# Patient Record
Sex: Male | Born: 1965 | Race: White | Hispanic: No | Marital: Single | Smoking: Former smoker
Health system: Southern US, Community
[De-identification: ages and names within clinical notes are randomized; demographics above are authoritative.]

## PROBLEM LIST (undated history)

## (undated) DIAGNOSIS — J439 Emphysema, unspecified: Secondary | ICD-10-CM

## (undated) DIAGNOSIS — J449 Chronic obstructive pulmonary disease, unspecified: Secondary | ICD-10-CM

## (undated) DIAGNOSIS — J189 Pneumonia, unspecified organism: Secondary | ICD-10-CM

## (undated) HISTORY — DX: Chronic obstructive pulmonary disease, unspecified: J44.9

## (undated) HISTORY — DX: Emphysema, unspecified: J43.9

## (undated) HISTORY — DX: Pneumonia, unspecified organism: J18.9

---

## 2000-06-22 ENCOUNTER — Ambulatory Visit (HOSPITAL_COMMUNITY): Admission: RE | Admit: 2000-06-22 | Discharge: 2000-06-22 | Payer: Self-pay | Admitting: Internal Medicine

## 2006-07-16 ENCOUNTER — Ambulatory Visit: Payer: Self-pay | Admitting: Cardiology

## 2006-07-28 ENCOUNTER — Ambulatory Visit: Payer: Self-pay

## 2006-08-20 ENCOUNTER — Ambulatory Visit: Payer: Self-pay | Admitting: Cardiology

## 2006-08-20 ENCOUNTER — Ambulatory Visit (HOSPITAL_COMMUNITY): Admission: RE | Admit: 2006-08-20 | Discharge: 2006-08-20 | Payer: Self-pay | Admitting: Cardiology

## 2010-07-09 NOTE — Assessment & Plan Note (Signed)
[facility]                            CARDIOLOGY OFFICE NOTE   NAME:Belk, QUEST                        MRN:          [id number]  DATE:07/16/2006                            DOB:          [birth year]    Franki Monte (brother of Jaiceon Collister) is here for cardiac evaluation.  Mr. Gartin is 45 years old.  He had some chest discomfort and was seen in  the emergency room at [facility].  His first blood studies showed  no acute abnormality.  He decided that he was stable and did not want to  wait any longer there, and he has remained stable.  There is a strength  family history of coronary disease.  The patient is markedly overweight.  He does work and lift heavy objects on a regular basis.   His chest pain occurred while he was driving to work.  He had sharp  pain.  He did not have any nausea, vomiting or diaphoresis.   PAST MEDICAL HISTORY:  Allergies:  No known drug allergies.   Medications:  Urecholine?   Other medical problems:  See the list below.   SOCIAL HISTORY:  The patient is a Museum/gallery exhibitions officer and a Designer, television/film set.  He also helps unload trailers.  He is single.  He smoked for many years  in the past but stopped smoking years ago.  He drinks a six-pack of beer  per week.  He also drinks an excessive amount of caffeine, drinking  [employer] and sweet tea on a regular basis.   FAMILY HISTORY:  He has a strong family history of coronary disease.  This affects his brother and his father.   OTHER MEDICAL PROBLEMS:  See the list below.   REVIEW OF SYSTEMS:  As of today, he is feeling well.  He is not having  any significant other problems.  He knows that he is overweight.  He  does have some problems with headaches.  He does have some exertional  shortness of breath.  He has had some urinary problems.   PHYSICAL EXAMINATION:  VITAL SIGNS:  The blood pressure is 110/54 with a  pulse of 79.  Weight is 296 pounds.  GENERAL:  The patient is  oriented to person, time and place.  Affect is  normal.  HEENT:  Reveals no xanthelasma.  He has normal extraocular motion.  Conjunctivae are normal.  There are no carotid bruits.  There is no  jugular venous distention.  The patient is edentulous.  He is markedly  overweight.  LUNGS:  Clear.  Respiratory effort is not labored.  CARDIAC:  Reveals an S1 with an S2.  There are no clicks or significant  murmurs.  ABDOMEN:  His abdomen is quite protuberant.  He has normal bowel sounds.  He has 2+ distal pulses.  There is no peripheral edema.  There are no  significant musculoskeletal deformities.   PROBLEMS:  1. Urinary problems of some type?  2. Status post right arm and hand surgery in the past.  3. Strong family history of coronary disease.  4. Recent  chest discomfort.  At this point we do not know if he has      definite coronary disease but he needs an exercise test for sure.   LABORATORIES:  We know that his chest x-ray at [facility] showed question  of a medial right lower lobe atelectasis at that time.   Other labs there also showed a BUN of 15 with a creatinine of 1.0.  His  single troponin was normal.  Liver functions were normal.   We will evaluate the patient with a stress Myoview scan and then I will  see him back.  I spoke with the patient at length about weight loss.  He  must cut down on his calories and caffeine and being to lose weight.     Luis Abed, MD, [facility]  Electronically Signed    JDK/MedQ  DD: 07/16/2006  DT: 07/16/2006  Job #: [id number]   cc:   Antony Madura, M.D.

## 2010-07-09 NOTE — Assessment & Plan Note (Signed)
[facility]                            CARDIOLOGY OFFICE NOTE   NAME:Charbonneau, EULOGIO                        MRN:          [id number]  DATE:08/20/2006                            DOB:          Aug 06, 1965    Mr. Boodram is back for followup.  I saw him on Jul 16, 2006.  His stress  Myoview scan revealed that there were no marked abnormalities.  There  was a question of some decrease in blood pressure with stress, but he  had no EKG change, and he has no other signs of abnormalities.  There  were no marked nuclear changes.  He did have some shortness of breath,  and his O2 sat at peak stress was 90%.   The patient today returns and in fact, appears to be motivated.  It is a  reminder that he did quit smoking approximately a year and a half ago.  He has now begun to try to lose weight.  He has lost 8 pounds since I  saw him last, and this is a good accomplishment for him.   PAST MEDICAL HISTORY:  Other medical problems, see the list below.   ALLERGIES:  No known drug allergies.   MEDICATIONS:  Question Urecholine.   REVIEW OF SYSTEMS:  He is feeling somewhat better; however, he does have  some chest discomfort.  It occurs primarily at rest.  We do need a chest  x-ray.  Otherwise, review of systems is negative.   PHYSICAL EXAMINATION:  VITAL SIGNS:  Blood pressure 116/62 with a pulse  of 90.  The patient is oriented to person, time, and place, and his  affect is normal.   We had a long discussion about his test results.  I explained to him  that I cannot tell him for sure about his coronary arteries, but there  appears to be no obvious significant obstruction at this time.  Of  course, I encouraged all of the forms of primary prevention.  He is  beginning to lose some weight.  We will plan to check his cholesterol.   PROBLEMS:  1. Urinary problems of some type?  2. Status post right arm and hand surgery in the past.  3. Strong family history of  coronary disease.  4. Some chest discomfort.  At this point, I am not convinced that this      is ischemia.  5. A question of an x-ray at [facility] with question of a      slight medial right lower lobe atelectasis and will repeat his      chest x-ray at this time.  He will also have a fasting lipid.     Luis Abed, MD, [facility]  Electronically Signed    JDK/MedQ  DD: 08/20/2006  DT: 08/20/2006  Job #: [id number]   cc:   Antony Madura, M.D.

## 2020-05-23 DIAGNOSIS — J439 Emphysema, unspecified: Secondary | ICD-10-CM | POA: Diagnosis not present

## 2020-05-23 DIAGNOSIS — I700 Atherosclerosis of aorta: Secondary | ICD-10-CM | POA: Diagnosis not present

## 2020-05-23 DIAGNOSIS — J189 Pneumonia, unspecified organism: Secondary | ICD-10-CM | POA: Diagnosis not present

## 2020-05-23 DIAGNOSIS — R918 Other nonspecific abnormal finding of lung field: Secondary | ICD-10-CM | POA: Diagnosis not present

## 2020-05-23 DIAGNOSIS — Z720 Tobacco use: Secondary | ICD-10-CM | POA: Diagnosis not present

## 2020-06-25 DIAGNOSIS — J189 Pneumonia, unspecified organism: Secondary | ICD-10-CM | POA: Diagnosis not present

## 2020-07-10 ENCOUNTER — Other Ambulatory Visit: Payer: Self-pay

## 2020-07-10 ENCOUNTER — Telehealth: Payer: Self-pay | Admitting: Pulmonary Disease

## 2020-07-10 ENCOUNTER — Ambulatory Visit (INDEPENDENT_AMBULATORY_CARE_PROVIDER_SITE_OTHER): Payer: BC Managed Care – PPO | Admitting: Pulmonary Disease

## 2020-07-10 ENCOUNTER — Encounter: Payer: Self-pay | Admitting: Pulmonary Disease

## 2020-07-10 VITALS — BP 140/80 | HR 83 | Temp 98.0°F | Ht 73.0 in | Wt 314.8 lb

## 2020-07-10 DIAGNOSIS — R918 Other nonspecific abnormal finding of lung field: Secondary | ICD-10-CM | POA: Diagnosis not present

## 2020-07-10 DIAGNOSIS — R0602 Shortness of breath: Secondary | ICD-10-CM | POA: Diagnosis not present

## 2020-07-10 NOTE — Patient Instructions (Signed)
Continue using Trelegy on a daily basis Albuterol as needed up to 4 times a day  We will obtain a breathing study on you on next visit  CT scan of the chest to follow-up on the spots in the lungs   Graded exercises as tolerated, on a regular basis  Weight loss efforts   I will see you in 3 months, call with significant concerns

## 2020-07-10 NOTE — Progress Notes (Signed)
Donald Fitzgerald    [id number]    [birth year]  Primary Care Physician:Campbell, Mila Homer., MD  Referring Physician: Wilmer Floor., MD 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 buPROPion (WELLBUTRIN XL) 150 MG 24 hr tablet Take 1 tablet by mouth daily.  . Fluticasone-Umeclidin-Vilant (TRELEGY ELLIPTA) 100-62.5-25 MCG/INH AEPB Inhale into the lungs.  Marland Kitchen ibuprofen (ADVIL) 600 MG tablet Take 600 mg by mouth 2 (two) times daily as needed.  . nicotine (NICODERM CQ - DOSED IN MG/24 HOURS) 14 mg/24hr patch 14 mg daily.  . nicotine (NICODERM CQ - DOSED IN MG/24 HR) 7 mg/24hr patch 7 mg daily.  Marland Kitchen oxyCODONE (OXY IR/ROXICODONE) 5 MG immediate release tablet Take 5 mg by mouth every 6 (six) hours as needed.  . rosuvastatin (CRESTOR) 10 MG tablet Take 10 mg by mouth daily.  . tamsulosin (FLOMAX) 0.4 MG CAPS capsule Take by mouth.   No facility-administered encounter medications on file as of 07/10/2020.    Allergies as of 07/10/2020  . (No Known Allergies)    Past Medical History:  Diagnosis Date  . COPD (chronic obstructive pulmonary disease) (HCC)   . Emphysema of lung (HCC)   . Pneumonia     No family history on file.  Social History   Socioeconomic History  . Marital status: Single    Spouse name: Not on file  . Number of children: Not on file  . Years of education: Not on file  . Highest education level: Not on file   Occupational History  . Not on file  Tobacco Use  . Smoking status: Not on file  . Smokeless tobacco: Not on file  Substance and Sexual Activity  . Alcohol use: Not on file  . Drug use: Not on file  . Sexual activity: Not on file  Other Topics Concern  . Not on file  Social History Narrative  . Not on file   Social Determinants of Health   Financial Resource Strain: Not on file  Food Insecurity: Not on file  Transportation Needs: Not on file  Physical Activity: Not on file  Stress: Not on file  Social Connections: Not on file  Intimate Partner Violence: Not on file    Review of Systems  Constitutional: Negative for fever.  Respiratory: Positive for cough and shortness of breath.   Cardiovascular: Negative for leg swelling.  Genitourinary: Positive for flank pain.  Psychiatric/Behavioral: Negative for sleep disturbance.    Vitals:   07/10/20 1015  BP: 140/80  Pulse: 83  SpO2: 100%     Physical Exam Constitutional:      Appearance: He is obese.  HENT:     Head: Normocephalic.     Nose: No congestion.     Mouth/Throat:     Mouth: Mucous membranes are moist.  Eyes:     General:        Right eye: No  discharge.        Left eye: No discharge.  Cardiovascular:     Rate and Rhythm: Normal rate and regular rhythm.     Heart sounds: No murmur heard. No friction rub.  Pulmonary:     Effort: No respiratory distress.     Breath sounds: No stridor. No wheezing or rhonchi.  Musculoskeletal:     Cervical back: No rigidity or tenderness.  Neurological:     Mental Status: He is alert.  Psychiatric:        Mood and Affect: Mood normal.    Data Reviewed: CT scan from March 2022 reviewed showing right lung nodules Emphysema, some groundglass changes  Chest x-ray with haziness at the bases bilaterally  Assessment:  COPD with exacerbation recently -Did have pneumonia recently -Emphysema noted on CT scan -Currently on Trelegy  Recent pneumonia -Appears to be  improving  Reformed smoker -Quit recently  Shortness of breath on exertion -Mild to moderate limitation  Lung nodules - notable right lung -CT scan was reviewed with the patient  Morbid obesity  Plan/Recommendations: Obtain pulmonary function test  Follow-up CT scan of the chest to follow-up on lung nodules  Graded exercise as tolerated  Encouraged aggressive weight loss measures  Continue Trelegy daily  Rescue albuterol use as needed  I spent 45 minutes dedicated to the care of this patient on the date of this encounter to include previsit review of records, face-to-face time with the patient discussing conditions above, post visit ordering of testing, clinical documentation with electronic health record and communicated necessary findings to members of the patient's care team  Virl Diamond MD [facility] 07/10/2020, 10:17 AM  CC: Wilmer Floor., MD

## 2020-07-10 NOTE — Telephone Encounter (Signed)
It was me His phone must be having issues it never acts like it picks up I will try him again

## 2020-07-10 NOTE — Telephone Encounter (Signed)
Hey guys, we received a message stating someone tried to call this patient but none of [place] in triage did and I see he has an order for CT and was wondering if one of you guys may have called him? Thanks!

## 2020-07-11 NOTE — Telephone Encounter (Signed)
Pt aware of appt.

## 2020-07-20 ENCOUNTER — Other Ambulatory Visit: Payer: BC Managed Care – PPO

## 2020-07-24 DIAGNOSIS — M545 Low back pain, unspecified: Secondary | ICD-10-CM | POA: Diagnosis not present

## 2020-08-01 ENCOUNTER — Other Ambulatory Visit: Payer: Self-pay

## 2020-08-01 ENCOUNTER — Ambulatory Visit (INDEPENDENT_AMBULATORY_CARE_PROVIDER_SITE_OTHER)
Admission: RE | Admit: 2020-08-01 | Discharge: 2020-08-01 | Disposition: A | Discharge status: Home / Self Care | Payer: BC Managed Care – PPO | Source: Ambulatory Visit | Attending: Pulmonary Disease | Admitting: Pulmonary Disease

## 2020-08-01 DIAGNOSIS — R918 Other nonspecific abnormal finding of lung field: Secondary | ICD-10-CM | POA: Diagnosis not present

## 2020-08-03 ENCOUNTER — Telehealth: Payer: Self-pay | Admitting: Pulmonary Disease

## 2020-08-03 NOTE — Telephone Encounter (Signed)
Ov notes reveal recently treated for pneumonia.  Will leave CT for Dr. Wynona Neat to review in detail with patient

## 2020-08-03 NOTE — Telephone Encounter (Signed)
IMPRESSION: 1. Focal area atelectasis and infiltrate in the right middle lobe. No similar areas of focal suspected pneumonia.   2. Scattered areas of mosaic attenuation, less evident than on previous study, likely due to underlying small airways obstructive disease.   3.  Underlying degree of centrilobular emphysematous change.   4. 3 mm nodular opacity anterior segment left upper lobe. No follow-up needed if patient is low-risk. Non-contrast chest CT can be considered in 12 months if patient is high-risk. This recommendation follows the consensus statement: Guidelines for Management of Incidental Pulmonary Nodules Detected on CT Images: From the Fleischner Society 2017; Radiology 2017; 284:228-243.   5. Subcarinal lymph node is mildly enlarged with slightly less enlarged than on prior study. No new adenopathy.   6.  There are foci of coronary artery calcification.   7.  Benign left adrenal adenoma measuring 2.0 x 1 4 cm.   Emphysema (ICD10-J43.9).   These results will be called to the ordering clinician or representative by the Radiologist Assistant, and communication documented in the PACS or [employer].   Routing to [employer] as Dr. Wynona Neat is off today. Tammy, please advise. Thanks!

## 2020-08-06 ENCOUNTER — Other Ambulatory Visit: Payer: Self-pay | Admitting: *Deleted

## 2020-08-06 DIAGNOSIS — R918 Other nonspecific abnormal finding of lung field: Secondary | ICD-10-CM

## 2020-08-06 NOTE — Telephone Encounter (Signed)
Called and spoke with patient, provided results/recommendations per Dr. Wynona Neat.  Attempted to schedule a f/u for patient, however, he works 12 hour shifts and did not have his calender for work in front of him.  Advised to call the office when he can make a f/u appointment.  Patient verbalized understanding.  Order placed for CT scan in 6 months.  Nothing further needed.

## 2020-08-06 NOTE — Telephone Encounter (Signed)
Called patient, left message on cell phone to call back to the office  CT shows resolving pneumonia Small lung nodules Smaller lymph node than on previous CT   Repeat CT in 6 months CT with contrast

## 2020-08-23 ENCOUNTER — Other Ambulatory Visit: Payer: BC Managed Care – PPO

## 2020-08-24 DIAGNOSIS — Z6841 Body Mass Index (BMI) 40.0 and over, adult: Secondary | ICD-10-CM | POA: Diagnosis not present

## 2020-08-29 DIAGNOSIS — M9903 Segmental and somatic dysfunction of lumbar region: Secondary | ICD-10-CM | POA: Diagnosis not present

## 2020-08-29 DIAGNOSIS — M5451 Vertebrogenic low back pain: Secondary | ICD-10-CM | POA: Diagnosis not present

## 2020-08-29 DIAGNOSIS — M461 Sacroiliitis, not elsewhere classified: Secondary | ICD-10-CM | POA: Diagnosis not present

## 2020-08-29 DIAGNOSIS — M9905 Segmental and somatic dysfunction of pelvic region: Secondary | ICD-10-CM | POA: Diagnosis not present

## 2020-09-04 DIAGNOSIS — M9903 Segmental and somatic dysfunction of lumbar region: Secondary | ICD-10-CM | POA: Diagnosis not present

## 2020-09-04 DIAGNOSIS — M5451 Vertebrogenic low back pain: Secondary | ICD-10-CM | POA: Diagnosis not present

## 2020-09-04 DIAGNOSIS — M9905 Segmental and somatic dysfunction of pelvic region: Secondary | ICD-10-CM | POA: Diagnosis not present

## 2020-09-04 DIAGNOSIS — M461 Sacroiliitis, not elsewhere classified: Secondary | ICD-10-CM | POA: Diagnosis not present

## 2020-09-07 DIAGNOSIS — M5451 Vertebrogenic low back pain: Secondary | ICD-10-CM | POA: Diagnosis not present

## 2020-09-07 DIAGNOSIS — M9905 Segmental and somatic dysfunction of pelvic region: Secondary | ICD-10-CM | POA: Diagnosis not present

## 2020-09-07 DIAGNOSIS — M9903 Segmental and somatic dysfunction of lumbar region: Secondary | ICD-10-CM | POA: Diagnosis not present

## 2020-09-07 DIAGNOSIS — M461 Sacroiliitis, not elsewhere classified: Secondary | ICD-10-CM | POA: Diagnosis not present

## 2020-09-08 DIAGNOSIS — R0981 Nasal congestion: Secondary | ICD-10-CM | POA: Diagnosis not present

## 2020-09-08 DIAGNOSIS — R0782 Intercostal pain: Secondary | ICD-10-CM | POA: Diagnosis not present

## 2020-09-08 DIAGNOSIS — Z20828 Contact with and (suspected) exposure to other viral communicable diseases: Secondary | ICD-10-CM | POA: Diagnosis not present

## 2020-09-12 DIAGNOSIS — M461 Sacroiliitis, not elsewhere classified: Secondary | ICD-10-CM | POA: Diagnosis not present

## 2020-09-12 DIAGNOSIS — M5451 Vertebrogenic low back pain: Secondary | ICD-10-CM | POA: Diagnosis not present

## 2020-09-12 DIAGNOSIS — M9905 Segmental and somatic dysfunction of pelvic region: Secondary | ICD-10-CM | POA: Diagnosis not present

## 2020-09-12 DIAGNOSIS — S335XXA Sprain of ligaments of lumbar spine, initial encounter: Secondary | ICD-10-CM | POA: Diagnosis not present

## 2020-09-12 DIAGNOSIS — M9903 Segmental and somatic dysfunction of lumbar region: Secondary | ICD-10-CM | POA: Diagnosis not present

## 2020-09-21 DIAGNOSIS — M461 Sacroiliitis, not elsewhere classified: Secondary | ICD-10-CM | POA: Diagnosis not present

## 2020-09-21 DIAGNOSIS — M9905 Segmental and somatic dysfunction of pelvic region: Secondary | ICD-10-CM | POA: Diagnosis not present

## 2020-09-21 DIAGNOSIS — M9903 Segmental and somatic dysfunction of lumbar region: Secondary | ICD-10-CM | POA: Diagnosis not present

## 2020-09-21 DIAGNOSIS — M5451 Vertebrogenic low back pain: Secondary | ICD-10-CM | POA: Diagnosis not present

## 2020-09-26 ENCOUNTER — Other Ambulatory Visit: Payer: Self-pay

## 2020-09-26 ENCOUNTER — Ambulatory Visit: Payer: BC Managed Care – PPO | Admitting: Pulmonary Disease

## 2020-09-26 ENCOUNTER — Encounter: Payer: Self-pay | Admitting: Pulmonary Disease

## 2020-09-26 VITALS — BP 118/72 | HR 79 | Temp 98.2°F | Ht 74.0 in | Wt 323.0 lb

## 2020-09-26 DIAGNOSIS — R0602 Shortness of breath: Secondary | ICD-10-CM

## 2020-09-26 NOTE — Patient Instructions (Signed)
Continue Trelegy -Make sure you rinse your mouth following use  We will get that breathing study prior to your next visit-on the day of visit  I will see you back in 6 months  Call with significant concerns

## 2020-09-26 NOTE — Progress Notes (Signed)
Donald Fitzgerald    [id number]    [birth year]  Primary Care Physician:Campbell, Mila Homer., MD  Referring Physician: Wilmer Floor., MD 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   buPROPion (WELLBUTRIN XL) 150 MG 24 hr tablet Take 1 tablet by mouth daily.   Fluticasone-Umeclidin-Vilant (TRELEGY ELLIPTA) 100-62.5-25 MCG/INH AEPB Inhale into the lungs.   ibuprofen (ADVIL) 600 MG tablet Take 600 mg by mouth 2 (two) times daily as needed.   nicotine (NICODERM CQ - DOSED IN MG/24 HOURS) 14 mg/24hr patch 14 mg daily.   nicotine (NICODERM CQ - DOSED IN MG/24 HR) 7 mg/24hr patch 7 mg daily.   oxyCODONE (OXY IR/ROXICODONE) 5 MG immediate release tablet Take 5 mg by mouth every 6 (six) hours as needed.   rosuvastatin (CRESTOR) 10 MG tablet Take 10 mg by mouth daily.   tamsulosin (FLOMAX) 0.4 MG CAPS capsule Take by mouth.   No facility-administered encounter medications on file as of 09/26/2020.    Allergies as of 09/26/2020   (No Known Allergies)    Past Medical History:  Diagnosis Date   COPD (chronic obstructive pulmonary disease) (HCC)    Emphysema of lung (HCC)     Pneumonia     No family history on file.  Social History   Socioeconomic History   Marital status: Single    Spouse name: Not on file   Number of children: Not on file   Years of education: Not on file   Highest education level: Not on file  Occupational History   Not on file  Tobacco Use   Smoking status: Former    Packs/day: 2.00    Years: 41.00    Pack years: 82.00    Types: Cigarettes    Start date: 07/11/1978    Quit date: 06/26/2020    Years since quitting: 0.2   Smokeless tobacco: Former    Quit date: 06/26/2020  Substance and Sexual Activity   Alcohol use: Not on file   Drug use: Not on file   Sexual activity: Not on file  Other Topics Concern   Not on file  Social History Narrative   Not on file   Social Determinants of Health   Financial Resource Strain: Not on file  Food Insecurity: Not on file  Transportation Needs: Not on file  Physical Activity: Not on file  Stress: Not on file  Social Connections: Not on file  Intimate Partner Violence: Not on file    Review of Systems  Constitutional:  Negative for fever.  Respiratory:  Positive for cough and shortness of breath.   Cardiovascular:  Negative for leg swelling.  Psychiatric/Behavioral:  Negative for sleep disturbance.    Vitals:   09/26/20 1611  BP: 118/72  Pulse: 79  Temp: 98.2 F (36.8 C)  SpO2: 95%    Physical Exam Constitutional:      Appearance: He is obese.  Eyes:     General:        Right eye: No discharge.        Left eye: No discharge.  Cardiovascular:     Rate and Rhythm: Normal rate and regular rhythm.     Heart sounds: No murmur heard.   No friction rub.  Pulmonary:     Effort: No respiratory distress.     Breath sounds: No stridor. No wheezing or rhonchi.  Musculoskeletal:     Cervical back: No rigidity or tenderness.  Neurological:     Mental Status: He is alert.  Psychiatric:        Mood and Affect: Mood normal.   Data Reviewed: CT scan from March 2022 reviewed  showing right lung nodules Emphysema, some groundglass changes  Chest x-ray with haziness at the bases bilaterally  Assessment:  COPD with exacerbation recently -Appears stable at the present time -Continue Trelegy on a regular basis Discussed the need to rinse on a regular basis following Trelegy use  Reformed smoker -Encouraged to stay off cigarettes  Shortness of breath on exertion -Mild to moderate limitation -This appears to be stable -We will continue to follow  Lung nodules - notable right lung -CT scan was reviewed with the patient  Morbid obesity  Plan/Recommendations: Will obtain PFT at next visit  Graded exercise as tolerated  Continue Trelegy daily  Rescue albuterol as needed  Follow-up in 6 months  Virl Diamond MD [facility] 09/26/2020, 4:22 PM  CC: Wilmer Floor., MD

## 2020-09-27 DIAGNOSIS — M9903 Segmental and somatic dysfunction of lumbar region: Secondary | ICD-10-CM | POA: Diagnosis not present

## 2020-09-27 DIAGNOSIS — M5451 Vertebrogenic low back pain: Secondary | ICD-10-CM | POA: Diagnosis not present

## 2020-09-27 DIAGNOSIS — M461 Sacroiliitis, not elsewhere classified: Secondary | ICD-10-CM | POA: Diagnosis not present

## 2020-09-27 DIAGNOSIS — M9905 Segmental and somatic dysfunction of pelvic region: Secondary | ICD-10-CM | POA: Diagnosis not present

## 2020-11-13 DIAGNOSIS — R051 Acute cough: Secondary | ICD-10-CM | POA: Diagnosis not present

## 2020-11-13 DIAGNOSIS — Z20828 Contact with and (suspected) exposure to other viral communicable diseases: Secondary | ICD-10-CM | POA: Diagnosis not present

## 2020-11-13 DIAGNOSIS — B370 Candidal stomatitis: Secondary | ICD-10-CM | POA: Diagnosis not present

## 2020-11-13 DIAGNOSIS — R0981 Nasal congestion: Secondary | ICD-10-CM | POA: Diagnosis not present

## 2020-11-13 DIAGNOSIS — J440 Chronic obstructive pulmonary disease with acute lower respiratory infection: Secondary | ICD-10-CM | POA: Diagnosis not present

## 2020-11-13 DIAGNOSIS — J189 Pneumonia, unspecified organism: Secondary | ICD-10-CM | POA: Diagnosis not present

## 2020-11-14 DIAGNOSIS — I10 Essential (primary) hypertension: Secondary | ICD-10-CM | POA: Diagnosis not present

## 2020-11-14 DIAGNOSIS — X501XXA Overexertion from prolonged static or awkward postures, initial encounter: Secondary | ICD-10-CM | POA: Diagnosis not present

## 2020-11-14 DIAGNOSIS — M549 Dorsalgia, unspecified: Secondary | ICD-10-CM | POA: Diagnosis not present

## 2020-11-14 DIAGNOSIS — R069 Unspecified abnormalities of breathing: Secondary | ICD-10-CM | POA: Diagnosis not present

## 2020-11-14 DIAGNOSIS — S335XXA Sprain of ligaments of lumbar spine, initial encounter: Secondary | ICD-10-CM | POA: Diagnosis not present

## 2020-11-14 DIAGNOSIS — J439 Emphysema, unspecified: Secondary | ICD-10-CM | POA: Diagnosis not present

## 2020-11-14 DIAGNOSIS — J80 Acute respiratory distress syndrome: Secondary | ICD-10-CM | POA: Diagnosis not present

## 2020-11-14 DIAGNOSIS — M545 Low back pain, unspecified: Secondary | ICD-10-CM | POA: Diagnosis not present

## 2020-12-06 DIAGNOSIS — R635 Abnormal weight gain: Secondary | ICD-10-CM | POA: Diagnosis not present

## 2020-12-06 DIAGNOSIS — Z6841 Body Mass Index (BMI) 40.0 and over, adult: Secondary | ICD-10-CM | POA: Diagnosis not present

## 2020-12-06 DIAGNOSIS — J189 Pneumonia, unspecified organism: Secondary | ICD-10-CM | POA: Diagnosis not present

## 2021-01-30 ENCOUNTER — Ambulatory Visit (INDEPENDENT_AMBULATORY_CARE_PROVIDER_SITE_OTHER): Payer: BC Managed Care – PPO | Admitting: Pulmonary Disease

## 2021-01-30 ENCOUNTER — Other Ambulatory Visit: Payer: Self-pay

## 2021-01-30 DIAGNOSIS — R0602 Shortness of breath: Secondary | ICD-10-CM | POA: Diagnosis not present

## 2021-01-30 LAB — PULMONARY FUNCTION TEST
DL/VA % pred: 88 %
DL/VA: 3.77 ml/min/mmHg/L
DLCO cor % pred: 63 %
DLCO cor: 20.29 ml/min/mmHg
DLCO unc % pred: 63 %
DLCO unc: 20.29 ml/min/mmHg
FEF 25-75 Post: 2.29 L/sec
FEF 25-75 Pre: 1.67 L/sec
FEF2575-%Change-Post: 37 %
FEF2575-%Pred-Post: 63 %
FEF2575-%Pred-Pre: 46 %
FEV1-%Change-Post: 6 %
FEV1-%Pred-Post: 58 %
FEV1-%Pred-Pre: 54 %
FEV1-Post: 2.51 L
FEV1-Pre: 2.36 L
FEV1FVC-%Change-Post: 4 %
FEV1FVC-%Pred-Pre: 95 %
FEV6-%Change-Post: 1 %
FEV6-%Pred-Post: 60 %
FEV6-%Pred-Pre: 59 %
FEV6-Post: 3.29 L
FEV6-Pre: 3.23 L
FEV6FVC-%Change-Post: 0 %
FEV6FVC-%Pred-Post: 103 %
FEV6FVC-%Pred-Pre: 104 %
FVC-%Change-Post: 1 %
FVC-%Pred-Post: 58 %
FVC-%Pred-Pre: 57 %
FVC-Post: 3.29 L
FVC-Pre: 3.23 L
Post FEV1/FVC ratio: 76 %
Post FEV6/FVC ratio: 100 %
Pre FEV1/FVC ratio: 73 %
Pre FEV6/FVC Ratio: 100 %
RV % pred: 104 %
RV: 2.46 L
TLC % pred: 81 %
TLC: 6.35 L

## 2021-01-30 NOTE — Progress Notes (Signed)
Full PFT performed today. °

## 2021-01-30 NOTE — Patient Instructions (Signed)
Full PFT performed today. °

## 2021-01-31 DIAGNOSIS — J449 Chronic obstructive pulmonary disease, unspecified: Secondary | ICD-10-CM | POA: Diagnosis not present

## 2021-01-31 DIAGNOSIS — R635 Abnormal weight gain: Secondary | ICD-10-CM | POA: Diagnosis not present

## 2021-01-31 DIAGNOSIS — I700 Atherosclerosis of aorta: Secondary | ICD-10-CM | POA: Diagnosis not present

## 2021-01-31 DIAGNOSIS — I10 Essential (primary) hypertension: Secondary | ICD-10-CM | POA: Diagnosis not present

## 2021-02-07 ENCOUNTER — Other Ambulatory Visit: Payer: BC Managed Care – PPO

## 2021-04-02 DIAGNOSIS — J449 Chronic obstructive pulmonary disease, unspecified: Secondary | ICD-10-CM | POA: Diagnosis not present

## 2021-04-02 DIAGNOSIS — I10 Essential (primary) hypertension: Secondary | ICD-10-CM | POA: Diagnosis not present

## 2021-04-02 DIAGNOSIS — Z125 Encounter for screening for malignant neoplasm of prostate: Secondary | ICD-10-CM | POA: Diagnosis not present

## 2021-04-02 DIAGNOSIS — F331 Major depressive disorder, recurrent, moderate: Secondary | ICD-10-CM | POA: Diagnosis not present

## 2021-04-02 DIAGNOSIS — E291 Testicular hypofunction: Secondary | ICD-10-CM | POA: Diagnosis not present

## 2021-04-02 DIAGNOSIS — R739 Hyperglycemia, unspecified: Secondary | ICD-10-CM | POA: Diagnosis not present

## 2021-04-02 DIAGNOSIS — I700 Atherosclerosis of aorta: Secondary | ICD-10-CM | POA: Diagnosis not present

## 2021-08-06 DIAGNOSIS — Z6839 Body mass index (BMI) 39.0-39.9, adult: Secondary | ICD-10-CM | POA: Diagnosis not present

## 2021-08-06 DIAGNOSIS — R635 Abnormal weight gain: Secondary | ICD-10-CM | POA: Diagnosis not present

## 2021-09-03 DIAGNOSIS — Z6839 Body mass index (BMI) 39.0-39.9, adult: Secondary | ICD-10-CM | POA: Diagnosis not present

## 2021-09-03 DIAGNOSIS — R635 Abnormal weight gain: Secondary | ICD-10-CM | POA: Diagnosis not present

## 2021-10-01 DIAGNOSIS — J449 Chronic obstructive pulmonary disease, unspecified: Secondary | ICD-10-CM | POA: Diagnosis not present

## 2021-10-01 DIAGNOSIS — F331 Major depressive disorder, recurrent, moderate: Secondary | ICD-10-CM | POA: Diagnosis not present

## 2021-10-01 DIAGNOSIS — R739 Hyperglycemia, unspecified: Secondary | ICD-10-CM | POA: Diagnosis not present

## 2021-10-01 DIAGNOSIS — I10 Essential (primary) hypertension: Secondary | ICD-10-CM | POA: Diagnosis not present

## 2021-10-01 DIAGNOSIS — I700 Atherosclerosis of aorta: Secondary | ICD-10-CM | POA: Diagnosis not present

## 2021-10-01 DIAGNOSIS — E291 Testicular hypofunction: Secondary | ICD-10-CM | POA: Diagnosis not present

## 2022-01-15 DIAGNOSIS — L0501 Pilonidal cyst with abscess: Secondary | ICD-10-CM | POA: Diagnosis not present

## 2022-01-17 DIAGNOSIS — L0231 Cutaneous abscess of buttock: Secondary | ICD-10-CM | POA: Diagnosis not present

## 2022-01-30 DIAGNOSIS — L0591 Pilonidal cyst without abscess: Secondary | ICD-10-CM | POA: Diagnosis not present

## 2022-04-01 DIAGNOSIS — J449 Chronic obstructive pulmonary disease, unspecified: Secondary | ICD-10-CM | POA: Diagnosis not present

## 2022-04-01 DIAGNOSIS — I700 Atherosclerosis of aorta: Secondary | ICD-10-CM | POA: Diagnosis not present

## 2022-04-01 DIAGNOSIS — Z23 Encounter for immunization: Secondary | ICD-10-CM | POA: Diagnosis not present

## 2022-04-01 DIAGNOSIS — I10 Essential (primary) hypertension: Secondary | ICD-10-CM | POA: Diagnosis not present

## 2022-04-01 DIAGNOSIS — R739 Hyperglycemia, unspecified: Secondary | ICD-10-CM | POA: Diagnosis not present

## 2022-04-01 DIAGNOSIS — F331 Major depressive disorder, recurrent, moderate: Secondary | ICD-10-CM | POA: Diagnosis not present

## 2022-04-01 DIAGNOSIS — E291 Testicular hypofunction: Secondary | ICD-10-CM | POA: Diagnosis not present

## 2022-06-19 DIAGNOSIS — J449 Chronic obstructive pulmonary disease, unspecified: Secondary | ICD-10-CM | POA: Diagnosis not present

## 2022-06-19 DIAGNOSIS — E291 Testicular hypofunction: Secondary | ICD-10-CM | POA: Diagnosis not present

## 2022-06-19 DIAGNOSIS — R635 Abnormal weight gain: Secondary | ICD-10-CM | POA: Diagnosis not present

## 2022-06-19 DIAGNOSIS — Z6838 Body mass index (BMI) 38.0-38.9, adult: Secondary | ICD-10-CM | POA: Diagnosis not present

## 2022-07-17 DIAGNOSIS — Z6838 Body mass index (BMI) 38.0-38.9, adult: Secondary | ICD-10-CM | POA: Diagnosis not present

## 2022-07-17 DIAGNOSIS — R635 Abnormal weight gain: Secondary | ICD-10-CM | POA: Diagnosis not present

## 2022-08-19 DIAGNOSIS — N401 Enlarged prostate with lower urinary tract symptoms: Secondary | ICD-10-CM | POA: Diagnosis not present

## 2022-08-19 DIAGNOSIS — Z6836 Body mass index (BMI) 36.0-36.9, adult: Secondary | ICD-10-CM | POA: Diagnosis not present

## 2022-08-19 DIAGNOSIS — L84 Corns and callosities: Secondary | ICD-10-CM | POA: Diagnosis not present

## 2022-08-19 DIAGNOSIS — R635 Abnormal weight gain: Secondary | ICD-10-CM | POA: Diagnosis not present

## 2022-09-02 DIAGNOSIS — K5909 Other constipation: Secondary | ICD-10-CM | POA: Diagnosis not present

## 2022-09-02 DIAGNOSIS — Z79899 Other long term (current) drug therapy: Secondary | ICD-10-CM | POA: Diagnosis not present

## 2022-09-02 DIAGNOSIS — N401 Enlarged prostate with lower urinary tract symptoms: Secondary | ICD-10-CM | POA: Diagnosis not present

## 2022-09-02 DIAGNOSIS — Z125 Encounter for screening for malignant neoplasm of prostate: Secondary | ICD-10-CM | POA: Diagnosis not present

## 2022-09-02 DIAGNOSIS — R339 Retention of urine, unspecified: Secondary | ICD-10-CM | POA: Diagnosis not present

## 2022-09-02 DIAGNOSIS — E669 Obesity, unspecified: Secondary | ICD-10-CM | POA: Diagnosis not present

## 2022-09-02 DIAGNOSIS — Z79890 Hormone replacement therapy (postmenopausal): Secondary | ICD-10-CM | POA: Diagnosis not present

## 2022-09-05 DIAGNOSIS — M79671 Pain in right foot: Secondary | ICD-10-CM | POA: Diagnosis not present

## 2022-09-05 DIAGNOSIS — M79672 Pain in left foot: Secondary | ICD-10-CM | POA: Diagnosis not present

## 2022-09-05 DIAGNOSIS — R269 Unspecified abnormalities of gait and mobility: Secondary | ICD-10-CM | POA: Diagnosis not present

## 2022-09-16 DIAGNOSIS — Z6835 Body mass index (BMI) 35.0-35.9, adult: Secondary | ICD-10-CM | POA: Diagnosis not present

## 2022-09-16 DIAGNOSIS — R635 Abnormal weight gain: Secondary | ICD-10-CM | POA: Diagnosis not present

## 2022-09-16 DIAGNOSIS — J439 Emphysema, unspecified: Secondary | ICD-10-CM | POA: Diagnosis not present

## 2022-10-14 DIAGNOSIS — R635 Abnormal weight gain: Secondary | ICD-10-CM | POA: Diagnosis not present

## 2022-10-14 DIAGNOSIS — I10 Essential (primary) hypertension: Secondary | ICD-10-CM | POA: Diagnosis not present

## 2022-10-14 DIAGNOSIS — I700 Atherosclerosis of aorta: Secondary | ICD-10-CM | POA: Diagnosis not present

## 2022-10-14 DIAGNOSIS — Z6834 Body mass index (BMI) 34.0-34.9, adult: Secondary | ICD-10-CM | POA: Diagnosis not present

## 2022-11-19 DIAGNOSIS — R635 Abnormal weight gain: Secondary | ICD-10-CM | POA: Diagnosis not present

## 2022-11-19 DIAGNOSIS — Z6833 Body mass index (BMI) 33.0-33.9, adult: Secondary | ICD-10-CM | POA: Diagnosis not present

## 2022-11-19 DIAGNOSIS — J439 Emphysema, unspecified: Secondary | ICD-10-CM | POA: Diagnosis not present

## 2022-11-19 DIAGNOSIS — N400 Enlarged prostate without lower urinary tract symptoms: Secondary | ICD-10-CM | POA: Diagnosis not present

## 2022-12-17 DIAGNOSIS — J439 Emphysema, unspecified: Secondary | ICD-10-CM | POA: Diagnosis not present

## 2022-12-17 DIAGNOSIS — R635 Abnormal weight gain: Secondary | ICD-10-CM | POA: Diagnosis not present

## 2022-12-17 DIAGNOSIS — Z6833 Body mass index (BMI) 33.0-33.9, adult: Secondary | ICD-10-CM | POA: Diagnosis not present

## 2022-12-23 IMAGING — CT CT CHEST W/O CM
2 of 4 series · 14 of 36 positions shown, 17 images · non-contrast
Comparison: Chest CT June 25, 2020; chest CT May 11, 2020

CLINICAL DATA: Previous areas of airspace opacity. Underlying
emphysematous change.

EXAM:
CT CHEST WITHOUT CONTRAST
TECHNIQUE: Multidetector CT imaging of the chest was performed following the
standard protocol without IV contrast.

[Series 2: thorax · axial · 0.88mm/px · z∈[-341,-71]mm · 11 of 161 slices shown, 14 images]
[im 13/161  mediastinal]
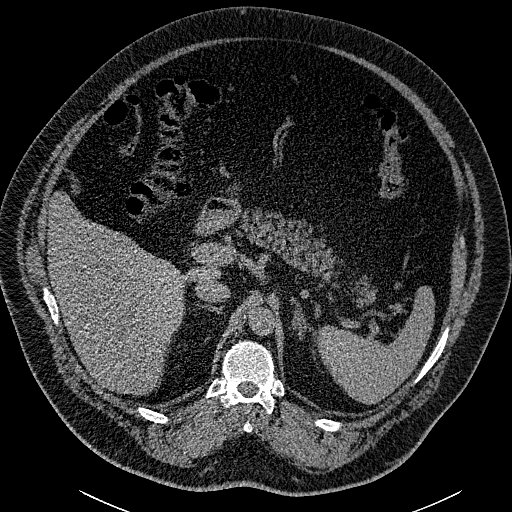
[im 13/161  lung]
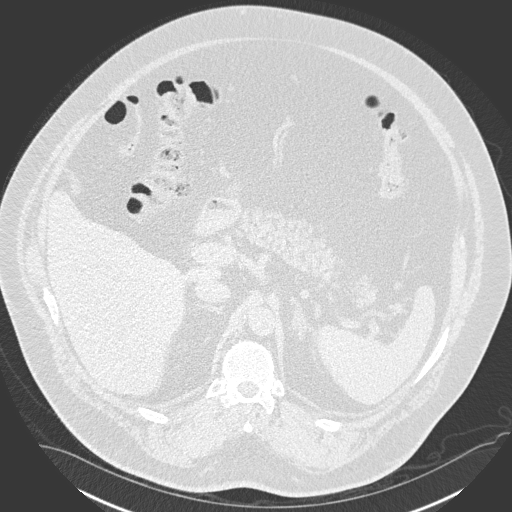
[im 25/161  lung]
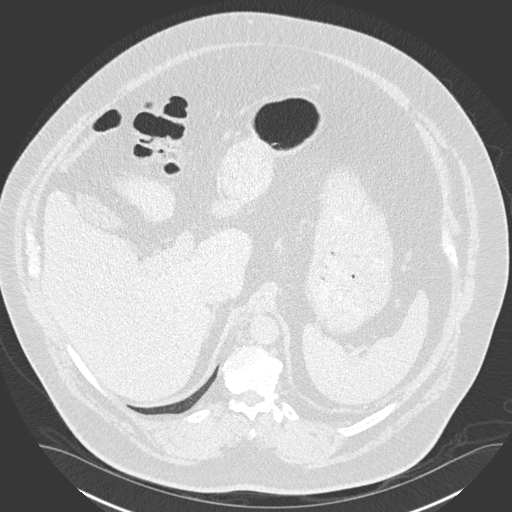
[im 37/161  lung]
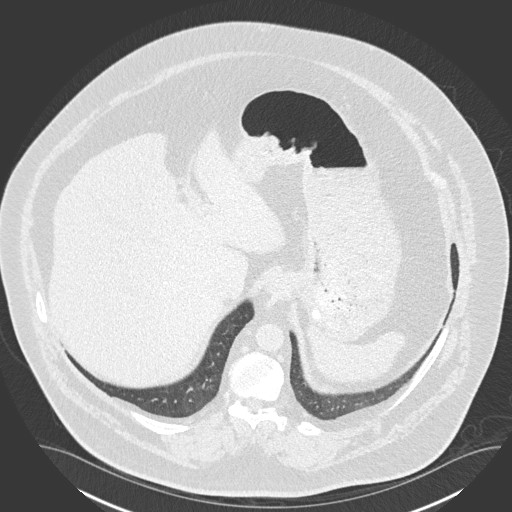
[im 50/161  lung]
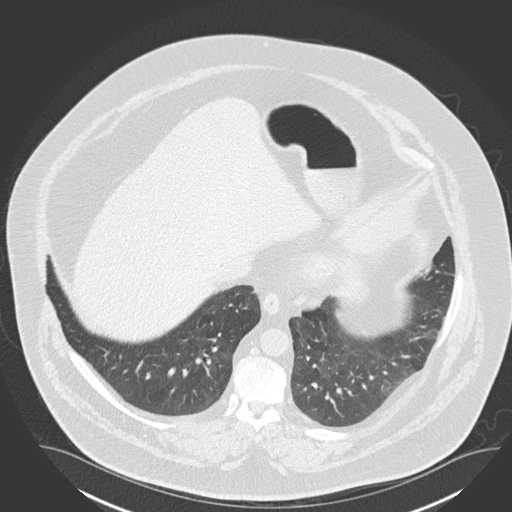
[im 62/161  mediastinal]
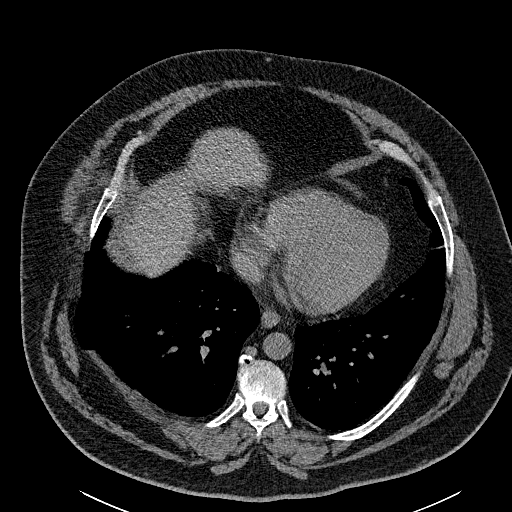
[im 62/161  lung]
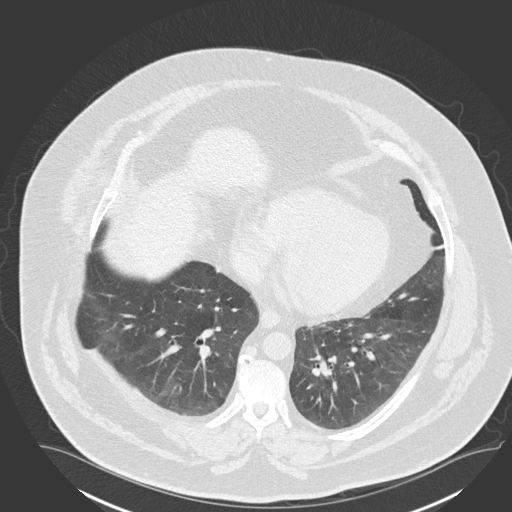
[im 87/161  lung]
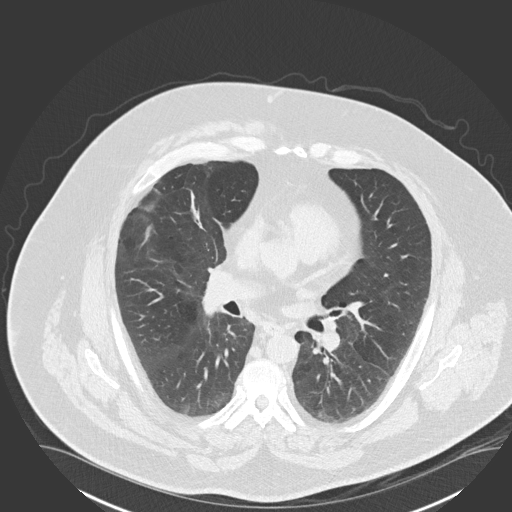
[im 99/161  lung]
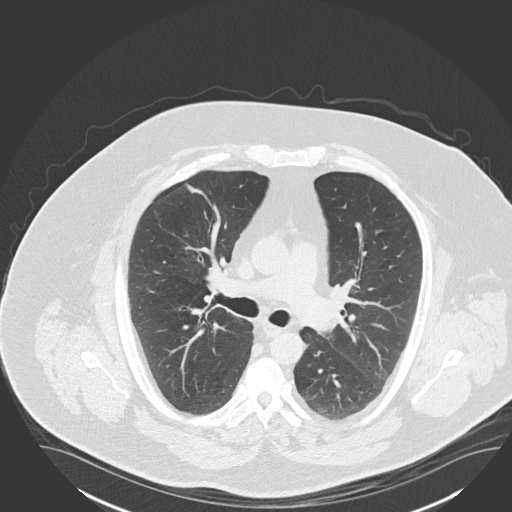
[im 111/161  lung]
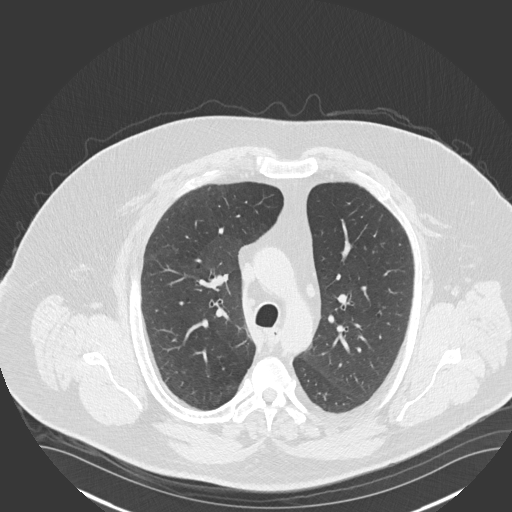
[im 124/161  mediastinal]
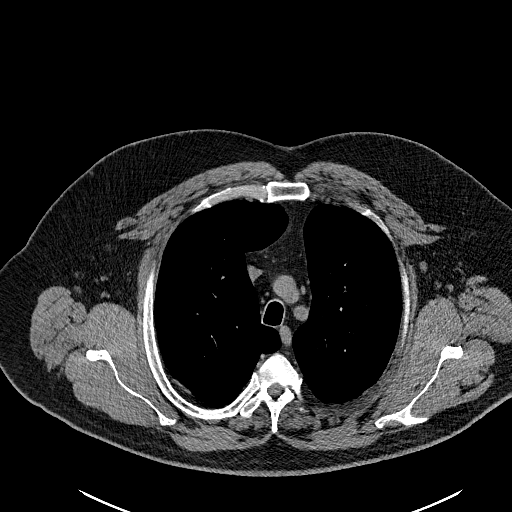
[im 124/161  lung]
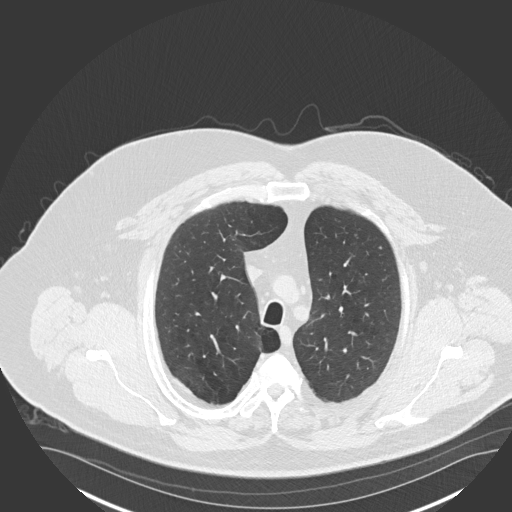
[im 136/161  lung]
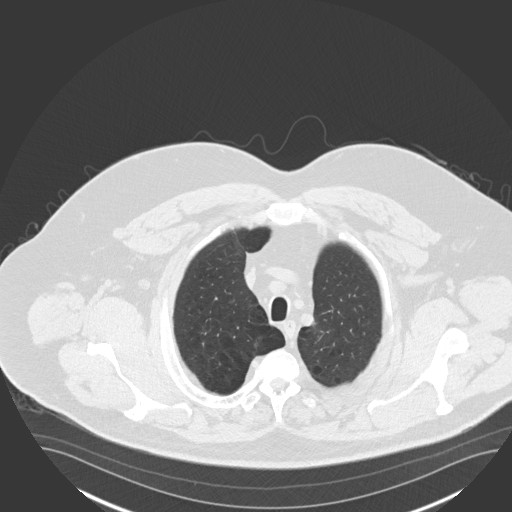
[im 148/161  lung]
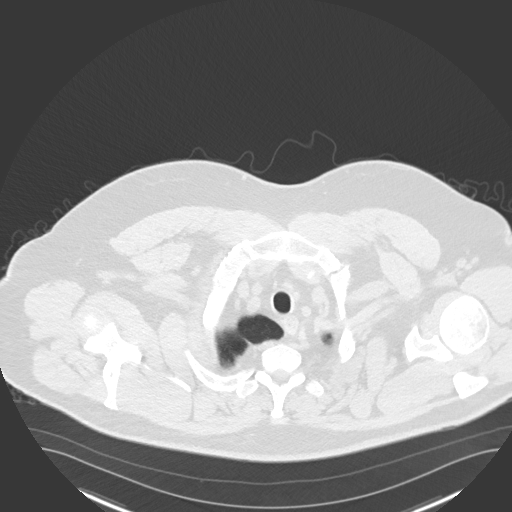

[Series 5: coronal · coronal · 0.63mm/px · 3 of 151 slices shown]
[im 31/151  lung]
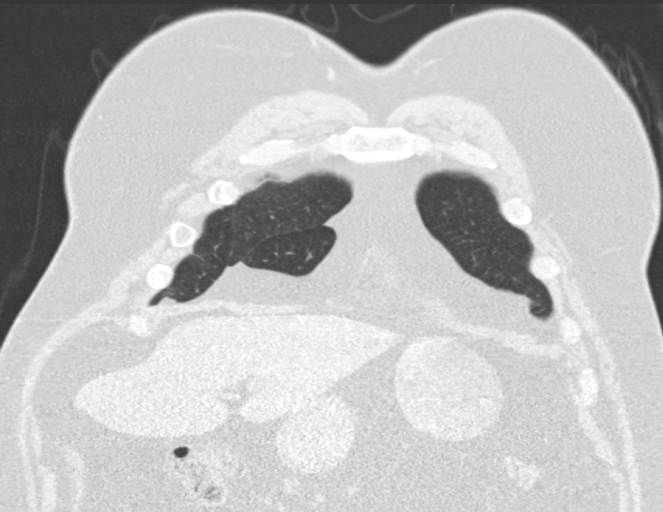
[im 61/151  lung]
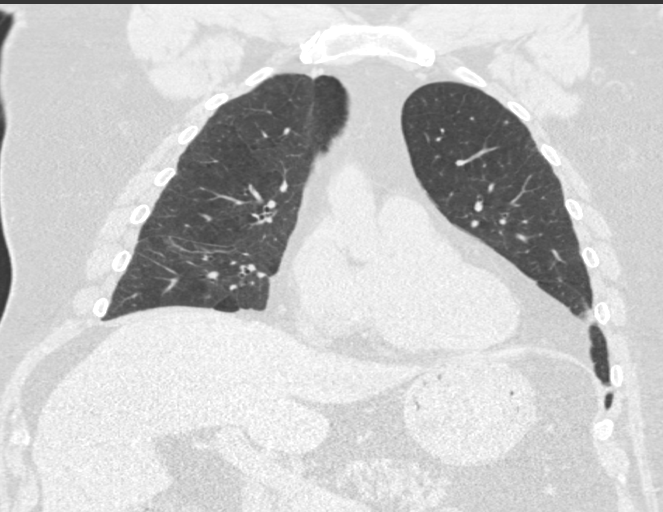
[im 91/151  lung]
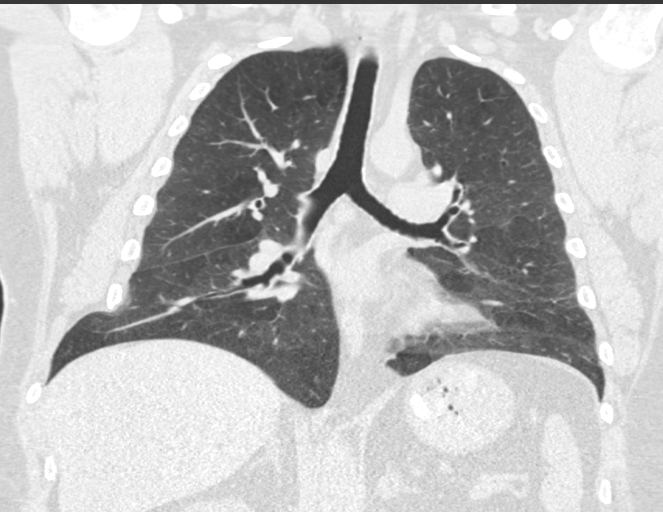

[14 of 36 positions shown; findings below may reference images not displayed]

FINDINGS: Cardiovascular: There is no evident thoracic aortic aneurysm.
Visualized great vessels appear unremarkable. There is mild
calcification in the region of the aortic valve. There are scattered
foci of coronary artery calcification. There is no pericardial
effusion or pericardial thickening.

Mediastinum/Nodes: Visualized thyroid appears normal. There is a
subcarinal lymph node measuring 1.5 x 1.3 cm, slightly smaller than
on previous study. No other lymph node enlargement evident. No
esophageal lesions evident.

Lungs/Pleura: There is a degree of underlying centrilobular
emphysematous change.

There is a focal area of atelectasis with ill-defined airspace
opacity right middle lobe, likely due to a degree of focal
pneumonia. There are areas of mild mosaic attenuation, less apparent
than on previous study. There is bibasilar atelectasis. On axial
slice 41 series 3, there is a 3 mm nodular opacity in the anterior
segment of the left upper lobe. No appreciable pleural effusions.

Trachea and major bronchial structures appear patent. No evident
pneumothorax.

Upper Abdomen: There is an apparent adenoma in the left adrenal
measuring 2.0 x 1.4 cm. Visualized upper abdominal structures
otherwise appear.

Musculoskeletal: There are no blastic or lytic bone lesions. No
evident chest wall lesions.
IMPRESSION: 1. Focal area atelectasis and infiltrate in the right middle lobe.
No similar areas of focal suspected pneumonia.

2. Scattered areas of mosaic attenuation, less evident than on
previous study, likely due to underlying small airways obstructive
disease.

3.  Underlying degree of centrilobular emphysematous change.

4. 3 mm nodular opacity anterior segment left upper lobe. No
follow-up needed if patient is low-risk. Non-contrast chest CT can
be considered in 12 months if patient is high-risk. This
recommendation follows the consensus statement: Guidelines for
Management of Incidental Pulmonary Nodules Detected on CT Images:

5. Subcarinal lymph node is mildly enlarged with slightly less
enlarged than on prior study. No new adenopathy.

6.  There are foci of coronary artery calcification.

7.  Benign left adrenal adenoma measuring 2.0 x 1 4 cm.

Emphysema ([id number]).

These results will be called to the ordering clinician or
representative by the Radiologist Assistant, and communication
documented in the PACS or [REDACTED].

## 2022-12-31 DIAGNOSIS — N401 Enlarged prostate with lower urinary tract symptoms: Secondary | ICD-10-CM | POA: Diagnosis not present

## 2022-12-31 DIAGNOSIS — R339 Retention of urine, unspecified: Secondary | ICD-10-CM | POA: Diagnosis not present

## 2023-01-05 DIAGNOSIS — R339 Retention of urine, unspecified: Secondary | ICD-10-CM | POA: Diagnosis not present

## 2023-01-29 DIAGNOSIS — Z23 Encounter for immunization: Secondary | ICD-10-CM | POA: Diagnosis not present

## 2023-01-29 DIAGNOSIS — R635 Abnormal weight gain: Secondary | ICD-10-CM | POA: Diagnosis not present

## 2023-01-29 DIAGNOSIS — J439 Emphysema, unspecified: Secondary | ICD-10-CM | POA: Diagnosis not present

## 2023-01-29 DIAGNOSIS — Z6832 Body mass index (BMI) 32.0-32.9, adult: Secondary | ICD-10-CM | POA: Diagnosis not present

## 2023-02-11 DIAGNOSIS — R339 Retention of urine, unspecified: Secondary | ICD-10-CM | POA: Diagnosis not present

## 2023-04-23 DIAGNOSIS — N401 Enlarged prostate with lower urinary tract symptoms: Secondary | ICD-10-CM | POA: Diagnosis not present

## 2023-04-23 DIAGNOSIS — N200 Calculus of kidney: Secondary | ICD-10-CM | POA: Diagnosis not present

## 2023-04-23 DIAGNOSIS — R339 Retention of urine, unspecified: Secondary | ICD-10-CM | POA: Diagnosis not present

## 2023-04-28 DIAGNOSIS — Z6830 Body mass index (BMI) 30.0-30.9, adult: Secondary | ICD-10-CM | POA: Diagnosis not present

## 2023-04-28 DIAGNOSIS — R635 Abnormal weight gain: Secondary | ICD-10-CM | POA: Diagnosis not present

## 2023-04-28 DIAGNOSIS — J449 Chronic obstructive pulmonary disease, unspecified: Secondary | ICD-10-CM | POA: Diagnosis not present

## 2023-04-28 DIAGNOSIS — I10 Essential (primary) hypertension: Secondary | ICD-10-CM | POA: Diagnosis not present

## 2023-04-28 DIAGNOSIS — E785 Hyperlipidemia, unspecified: Secondary | ICD-10-CM | POA: Diagnosis not present

## 2023-05-07 DIAGNOSIS — N200 Calculus of kidney: Secondary | ICD-10-CM | POA: Diagnosis not present

## 2023-05-08 DIAGNOSIS — N200 Calculus of kidney: Secondary | ICD-10-CM | POA: Diagnosis not present

## 2023-05-25 DIAGNOSIS — M940 Chondrocostal junction syndrome [Tietze]: Secondary | ICD-10-CM | POA: Diagnosis not present

## 2023-05-25 DIAGNOSIS — E663 Overweight: Secondary | ICD-10-CM | POA: Diagnosis not present

## 2023-05-25 DIAGNOSIS — R059 Cough, unspecified: Secondary | ICD-10-CM | POA: Diagnosis not present

## 2023-05-25 DIAGNOSIS — Z6829 Body mass index (BMI) 29.0-29.9, adult: Secondary | ICD-10-CM | POA: Diagnosis not present

## 2023-06-04 DIAGNOSIS — Z01818 Encounter for other preprocedural examination: Secondary | ICD-10-CM | POA: Diagnosis not present

## 2023-07-30 DIAGNOSIS — Z6829 Body mass index (BMI) 29.0-29.9, adult: Secondary | ICD-10-CM | POA: Diagnosis not present

## 2023-07-30 DIAGNOSIS — R635 Abnormal weight gain: Secondary | ICD-10-CM | POA: Diagnosis not present

## 2023-08-21 DIAGNOSIS — N401 Enlarged prostate with lower urinary tract symptoms: Secondary | ICD-10-CM | POA: Diagnosis not present

## 2023-08-21 DIAGNOSIS — C672 Malignant neoplasm of lateral wall of bladder: Secondary | ICD-10-CM | POA: Diagnosis not present

## 2023-08-21 DIAGNOSIS — R339 Retention of urine, unspecified: Secondary | ICD-10-CM | POA: Diagnosis not present

## 2023-08-21 DIAGNOSIS — N210 Calculus in bladder: Secondary | ICD-10-CM | POA: Diagnosis not present

## 2023-09-01 DIAGNOSIS — Z6830 Body mass index (BMI) 30.0-30.9, adult: Secondary | ICD-10-CM | POA: Diagnosis not present

## 2023-09-01 DIAGNOSIS — E291 Testicular hypofunction: Secondary | ICD-10-CM | POA: Diagnosis not present

## 2023-09-01 DIAGNOSIS — R635 Abnormal weight gain: Secondary | ICD-10-CM | POA: Diagnosis not present

## 2023-09-01 DIAGNOSIS — E785 Hyperlipidemia, unspecified: Secondary | ICD-10-CM | POA: Diagnosis not present

## 2023-09-01 DIAGNOSIS — I10 Essential (primary) hypertension: Secondary | ICD-10-CM | POA: Diagnosis not present

## 2023-09-24 DIAGNOSIS — N210 Calculus in bladder: Secondary | ICD-10-CM | POA: Diagnosis not present

## 2023-09-24 DIAGNOSIS — N401 Enlarged prostate with lower urinary tract symptoms: Secondary | ICD-10-CM | POA: Diagnosis not present

## 2023-09-24 DIAGNOSIS — C672 Malignant neoplasm of lateral wall of bladder: Secondary | ICD-10-CM | POA: Diagnosis not present

## 2023-09-24 DIAGNOSIS — R339 Retention of urine, unspecified: Secondary | ICD-10-CM | POA: Diagnosis not present

## 2023-10-01 DIAGNOSIS — R3912 Poor urinary stream: Secondary | ICD-10-CM | POA: Diagnosis not present

## 2023-10-01 DIAGNOSIS — Z79899 Other long term (current) drug therapy: Secondary | ICD-10-CM | POA: Diagnosis not present

## 2023-10-01 DIAGNOSIS — C679 Malignant neoplasm of bladder, unspecified: Secondary | ICD-10-CM | POA: Diagnosis not present

## 2023-10-01 DIAGNOSIS — C672 Malignant neoplasm of lateral wall of bladder: Secondary | ICD-10-CM | POA: Diagnosis not present

## 2023-10-01 DIAGNOSIS — E669 Obesity, unspecified: Secondary | ICD-10-CM | POA: Diagnosis not present

## 2023-10-01 DIAGNOSIS — J449 Chronic obstructive pulmonary disease, unspecified: Secondary | ICD-10-CM | POA: Diagnosis not present

## 2023-10-01 DIAGNOSIS — N401 Enlarged prostate with lower urinary tract symptoms: Secondary | ICD-10-CM | POA: Diagnosis not present

## 2023-10-01 DIAGNOSIS — K219 Gastro-esophageal reflux disease without esophagitis: Secondary | ICD-10-CM | POA: Diagnosis not present

## 2023-10-01 DIAGNOSIS — R3915 Urgency of urination: Secondary | ICD-10-CM | POA: Diagnosis not present

## 2023-10-01 DIAGNOSIS — E785 Hyperlipidemia, unspecified: Secondary | ICD-10-CM | POA: Diagnosis not present

## 2023-10-01 DIAGNOSIS — J439 Emphysema, unspecified: Secondary | ICD-10-CM | POA: Diagnosis not present

## 2023-10-01 DIAGNOSIS — Z87442 Personal history of urinary calculi: Secondary | ICD-10-CM | POA: Diagnosis not present

## 2023-10-01 DIAGNOSIS — R338 Other retention of urine: Secondary | ICD-10-CM | POA: Diagnosis not present

## 2023-10-01 DIAGNOSIS — Z87891 Personal history of nicotine dependence: Secondary | ICD-10-CM | POA: Diagnosis not present

## 2023-10-01 DIAGNOSIS — F331 Major depressive disorder, recurrent, moderate: Secondary | ICD-10-CM | POA: Diagnosis not present

## 2023-10-01 DIAGNOSIS — K5909 Other constipation: Secondary | ICD-10-CM | POA: Diagnosis not present

## 2023-10-01 DIAGNOSIS — I10 Essential (primary) hypertension: Secondary | ICD-10-CM | POA: Diagnosis not present

## 2023-10-01 DIAGNOSIS — Z6831 Body mass index (BMI) 31.0-31.9, adult: Secondary | ICD-10-CM | POA: Diagnosis not present

## 2023-10-01 DIAGNOSIS — N210 Calculus in bladder: Secondary | ICD-10-CM | POA: Diagnosis not present

## 2023-10-07 DIAGNOSIS — R635 Abnormal weight gain: Secondary | ICD-10-CM | POA: Diagnosis not present

## 2023-10-07 DIAGNOSIS — Z6830 Body mass index (BMI) 30.0-30.9, adult: Secondary | ICD-10-CM | POA: Diagnosis not present

## 2023-10-07 DIAGNOSIS — E291 Testicular hypofunction: Secondary | ICD-10-CM | POA: Diagnosis not present

## 2023-11-10 DIAGNOSIS — E291 Testicular hypofunction: Secondary | ICD-10-CM | POA: Diagnosis not present

## 2023-11-10 DIAGNOSIS — Z6830 Body mass index (BMI) 30.0-30.9, adult: Secondary | ICD-10-CM | POA: Diagnosis not present

## 2023-11-10 DIAGNOSIS — R635 Abnormal weight gain: Secondary | ICD-10-CM | POA: Diagnosis not present

## 2023-12-01 DIAGNOSIS — N401 Enlarged prostate with lower urinary tract symptoms: Secondary | ICD-10-CM | POA: Diagnosis not present

## 2023-12-01 DIAGNOSIS — R339 Retention of urine, unspecified: Secondary | ICD-10-CM | POA: Diagnosis not present

## 2023-12-01 DIAGNOSIS — C672 Malignant neoplasm of lateral wall of bladder: Secondary | ICD-10-CM | POA: Diagnosis not present

## 2023-12-01 DIAGNOSIS — Z125 Encounter for screening for malignant neoplasm of prostate: Secondary | ICD-10-CM | POA: Diagnosis not present

## 2023-12-08 DIAGNOSIS — Z6830 Body mass index (BMI) 30.0-30.9, adult: Secondary | ICD-10-CM | POA: Diagnosis not present

## 2023-12-08 DIAGNOSIS — R635 Abnormal weight gain: Secondary | ICD-10-CM | POA: Diagnosis not present

## 2023-12-08 DIAGNOSIS — Z23 Encounter for immunization: Secondary | ICD-10-CM | POA: Diagnosis not present

## 2023-12-22 DIAGNOSIS — C672 Malignant neoplasm of lateral wall of bladder: Secondary | ICD-10-CM | POA: Diagnosis not present

## 2023-12-22 DIAGNOSIS — N401 Enlarged prostate with lower urinary tract symptoms: Secondary | ICD-10-CM | POA: Diagnosis not present

## 2023-12-25 DIAGNOSIS — C672 Malignant neoplasm of lateral wall of bladder: Secondary | ICD-10-CM | POA: Diagnosis not present

## 2023-12-28 DIAGNOSIS — C672 Malignant neoplasm of lateral wall of bladder: Secondary | ICD-10-CM | POA: Diagnosis not present

## 2023-12-30 DIAGNOSIS — C672 Malignant neoplasm of lateral wall of bladder: Secondary | ICD-10-CM | POA: Diagnosis not present

## 2023-12-30 DIAGNOSIS — N401 Enlarged prostate with lower urinary tract symptoms: Secondary | ICD-10-CM | POA: Diagnosis not present

## 2024-01-05 DIAGNOSIS — N401 Enlarged prostate with lower urinary tract symptoms: Secondary | ICD-10-CM | POA: Diagnosis not present

## 2024-01-05 DIAGNOSIS — C672 Malignant neoplasm of lateral wall of bladder: Secondary | ICD-10-CM | POA: Diagnosis not present

## 2024-01-13 DIAGNOSIS — C672 Malignant neoplasm of lateral wall of bladder: Secondary | ICD-10-CM | POA: Diagnosis not present

## 2024-01-13 DIAGNOSIS — N401 Enlarged prostate with lower urinary tract symptoms: Secondary | ICD-10-CM | POA: Diagnosis not present

## 2024-01-14 DIAGNOSIS — R635 Abnormal weight gain: Secondary | ICD-10-CM | POA: Diagnosis not present

## 2024-01-14 DIAGNOSIS — Z6829 Body mass index (BMI) 29.0-29.9, adult: Secondary | ICD-10-CM | POA: Diagnosis not present

## 2024-01-19 DIAGNOSIS — N401 Enlarged prostate with lower urinary tract symptoms: Secondary | ICD-10-CM | POA: Diagnosis not present

## 2024-01-19 DIAGNOSIS — C672 Malignant neoplasm of lateral wall of bladder: Secondary | ICD-10-CM | POA: Diagnosis not present

## 2024-01-27 DIAGNOSIS — N401 Enlarged prostate with lower urinary tract symptoms: Secondary | ICD-10-CM | POA: Diagnosis not present

## 2024-01-27 DIAGNOSIS — C672 Malignant neoplasm of lateral wall of bladder: Secondary | ICD-10-CM | POA: Diagnosis not present

## 2024-02-02 DIAGNOSIS — N401 Enlarged prostate with lower urinary tract symptoms: Secondary | ICD-10-CM | POA: Diagnosis not present

## 2024-02-02 DIAGNOSIS — C672 Malignant neoplasm of lateral wall of bladder: Secondary | ICD-10-CM | POA: Diagnosis not present
